# Patient Record
Sex: Female | Born: 1995 | Race: White | Hispanic: No | Marital: Single | Smoking: Former smoker
Health system: Southern US, Community
[De-identification: ages and names within clinical notes are randomized; demographics above are authoritative.]

## PROBLEM LIST (undated history)

## (undated) DIAGNOSIS — M545 Low back pain, unspecified: Secondary | ICD-10-CM

## (undated) HISTORY — PX: WISDOM TOOTH EXTRACTION: SHX21

---
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
## 2017-02-22 ENCOUNTER — Emergency Department: Payer: 59

## 2017-02-22 ENCOUNTER — Observation Stay
Admission: EM | Admit: 2017-02-22 | Discharge: 2017-02-23 | Disposition: A | Discharge status: Skilled Nursing Facility | Payer: 59 | Attending: Orthopedic Surgery | Admitting: Orthopedic Surgery

## 2017-02-22 ENCOUNTER — Encounter: Payer: Self-pay | Admitting: Emergency Medicine

## 2017-02-22 ENCOUNTER — Emergency Department: Payer: 59 | Admitting: Certified Registered Nurse Anesthetist

## 2017-02-22 ENCOUNTER — Encounter
Admission: EM | Disposition: A | Discharge status: Skilled Nursing Facility | Payer: Self-pay | Source: Home / Self Care | Attending: Student in an Organized Health Care Education/Training Program

## 2017-02-22 ENCOUNTER — Ambulatory Visit: Admit: 2017-02-22 | Payer: 59 | Admitting: Orthopedic Surgery

## 2017-02-22 DIAGNOSIS — S81811A Laceration without foreign body, right lower leg, initial encounter: Secondary | ICD-10-CM | POA: Diagnosis present

## 2017-02-22 DIAGNOSIS — S81012A Laceration without foreign body, left knee, initial encounter: Secondary | ICD-10-CM | POA: Diagnosis not present

## 2017-02-22 DIAGNOSIS — T07XXXA Unspecified multiple injuries, initial encounter: Secondary | ICD-10-CM

## 2017-02-22 DIAGNOSIS — S81002A Unspecified open wound, left knee, initial encounter: Secondary | ICD-10-CM | POA: Diagnosis present

## 2017-02-22 DIAGNOSIS — S8982XA Other specified injuries of left lower leg, initial encounter: Secondary | ICD-10-CM | POA: Insufficient documentation

## 2017-02-22 DIAGNOSIS — Y9355 Activity, bike riding: Secondary | ICD-10-CM | POA: Diagnosis not present

## 2017-02-22 DIAGNOSIS — Y9389 Activity, other specified: Secondary | ICD-10-CM | POA: Insufficient documentation

## 2017-02-22 HISTORY — PX: I&D EXTREMITY: SHX5045

## 2017-02-22 LAB — TYPE AND SCREEN
ABO/RH(D): A NEG
Antibody Screen: NEGATIVE

## 2017-02-22 LAB — CBC WITH DIFFERENTIAL/PLATELET
BASOS ABS: 0.1 10*3/uL (ref 0–0.1)
BASOS PCT: 1 %
Eosinophils Absolute: 0.2 10*3/uL (ref 0–0.7)
Eosinophils Relative: 2 %
HEMATOCRIT: 41.4 % (ref 35.0–47.0)
Hemoglobin: 14.1 g/dL (ref 12.0–16.0)
Lymphocytes Relative: 28 %
Lymphs Abs: 3 10*3/uL (ref 1.0–3.6)
MCH: 30 pg (ref 26.0–34.0)
MCHC: 34 g/dL (ref 32.0–36.0)
MCV: 88.3 fL (ref 80.0–100.0)
MONO ABS: 0.6 10*3/uL (ref 0.2–0.9)
Monocytes Relative: 5 %
NEUTROS ABS: 7.1 10*3/uL — AB (ref 1.4–6.5)
Neutrophils Relative %: 64 %
PLATELETS: 397 10*3/uL (ref 150–440)
RBC: 4.69 MIL/uL (ref 3.80–5.20)
RDW: 13 % (ref 11.5–14.5)
WBC: 10.9 10*3/uL (ref 3.6–11.0)

## 2017-02-22 LAB — COMPREHENSIVE METABOLIC PANEL
ALBUMIN: 4 g/dL (ref 3.5–5.0)
ALT: 24 U/L (ref 14–54)
AST: 30 U/L (ref 15–41)
Alkaline Phosphatase: 78 U/L (ref 38–126)
Anion gap: 8 (ref 5–15)
BILIRUBIN TOTAL: 0.8 mg/dL (ref 0.3–1.2)
BUN: 10 mg/dL (ref 6–20)
CHLORIDE: 109 mmol/L (ref 101–111)
CO2: 21 mmol/L — ABNORMAL LOW (ref 22–32)
Calcium: 8.8 mg/dL — ABNORMAL LOW (ref 8.9–10.3)
Creatinine, Ser: 0.62 mg/dL (ref 0.44–1.00)
GFR calc Af Amer: >60 mL/min (ref >60)
GFR calc non Af Amer: >60 mL/min (ref >60)
GLUCOSE: 121 mg/dL — AB (ref 65–99)
POTASSIUM: 3.7 mmol/L (ref 3.5–5.1)
Sodium: 138 mmol/L (ref 135–145)
Total Protein: 7.3 g/dL (ref 6.5–8.1)

## 2017-02-22 LAB — HCG, QUANTITATIVE, PREGNANCY: hCG, Beta Chain, Quant, S: <1 m[IU]/mL (ref <5)

## 2017-02-22 LAB — ETHANOL

## 2017-02-22 SURGERY — IRRIGATION AND DEBRIDEMENT EXTREMITY
Anesthesia: General | Site: Knee | Laterality: Left | Wound class: Dirty or Infected

## 2017-02-22 MED ORDER — SUCCINYLCHOLINE CHLORIDE 20 MG/ML IJ SOLN
INTRAMUSCULAR | Status: DC | PRN
  Administered 2017-02-22: 80 mg via INTRAVENOUS

## 2017-02-22 MED ORDER — CEFAZOLIN IN D5W 1 GM/50ML IV SOLN
1.0000 g | Freq: Once | INTRAVENOUS | Status: AC
  Administered 2017-02-22: 1 g via INTRAVENOUS
  Filled 2017-02-22: qty 50

## 2017-02-22 MED ORDER — ONDANSETRON HCL 4 MG/2ML IJ SOLN
4.0000 mg | Freq: Once | INTRAMUSCULAR | Status: AC
  Administered 2017-02-22: 4 mg via INTRAVENOUS

## 2017-02-22 MED ORDER — ACETAMINOPHEN 10 MG/ML IV SOLN
INTRAVENOUS | Status: DC | PRN
  Administered 2017-02-22: 1000 mg via INTRAVENOUS

## 2017-02-22 MED ORDER — MORPHINE SULFATE (PF) 4 MG/ML IV SOLN
INTRAVENOUS | Status: AC
  Administered 2017-02-22: 4 mg via INTRAVENOUS
  Filled 2017-02-22: qty 1

## 2017-02-22 MED ORDER — METHOCARBAMOL 500 MG PO TABS
500.0000 mg | ORAL_TABLET | Freq: Four times a day (QID) | ORAL | Status: DC | PRN
  Administered 2017-02-23 (×2): 500 mg via ORAL
  Filled 2017-02-22 (×2): qty 1

## 2017-02-22 MED ORDER — ONDANSETRON HCL 4 MG/2ML IJ SOLN
INTRAMUSCULAR | Status: AC
  Filled 2017-02-22: qty 2

## 2017-02-22 MED ORDER — DOCUSATE SODIUM 100 MG PO CAPS
100.0000 mg | ORAL_CAPSULE | Freq: Two times a day (BID) | ORAL | Status: DC
  Administered 2017-02-22 – 2017-02-23 (×2): 100 mg via ORAL
  Filled 2017-02-22 (×2): qty 1

## 2017-02-22 MED ORDER — SODIUM CHLORIDE 0.9 % IV SOLN
INTRAVENOUS | Status: DC
  Administered 2017-02-22: 21:00:00 via INTRAVENOUS

## 2017-02-22 MED ORDER — FENTANYL CITRATE (PF) 100 MCG/2ML IJ SOLN
100.0000 ug | INTRAMUSCULAR | Status: DC | PRN
  Administered 2017-02-22 (×4): 100 ug via INTRAVENOUS
  Filled 2017-02-22 (×4): qty 2

## 2017-02-22 MED ORDER — DEXAMETHASONE SODIUM PHOSPHATE 10 MG/ML IJ SOLN
INTRAMUSCULAR | Status: AC
  Filled 2017-02-22: qty 1

## 2017-02-22 MED ORDER — BUPIVACAINE HCL (PF) 0.5 % IJ SOLN
30.0000 mL | Freq: Once | INTRAMUSCULAR | Status: AC
  Administered 2017-02-22: 30 mL
  Filled 2017-02-22: qty 30

## 2017-02-22 MED ORDER — MIDAZOLAM HCL 2 MG/2ML IJ SOLN
INTRAMUSCULAR | Status: AC
  Filled 2017-02-22: qty 2

## 2017-02-22 MED ORDER — ZOLPIDEM TARTRATE 5 MG PO TABS: 5.0000 mg | ORAL_TABLET | Freq: Every evening | ORAL | Status: DC | PRN

## 2017-02-22 MED ORDER — LIDOCAINE HCL (PF) 2 % IJ SOLN
INTRAMUSCULAR | Status: AC
  Filled 2017-02-22: qty 2

## 2017-02-22 MED ORDER — BUPIVACAINE HCL 0.5 % IJ SOLN
INTRAMUSCULAR | Status: DC | PRN
Start: 2017-02-22 — End: 2017-02-22
  Administered 2017-02-22: 30 mL

## 2017-02-22 MED ORDER — ONDANSETRON HCL 4 MG/2ML IJ SOLN: 4.0000 mg | Freq: Once | INTRAMUSCULAR | Status: DC

## 2017-02-22 MED ORDER — PROPOFOL 10 MG/ML IV BOLUS
INTRAVENOUS | Status: DC | PRN
  Administered 2017-02-22: 150 mg via INTRAVENOUS
  Administered 2017-02-22: 50 mg via INTRAVENOUS

## 2017-02-22 MED ORDER — ACETAMINOPHEN 325 MG PO TABS: 650.0000 mg | ORAL_TABLET | Freq: Four times a day (QID) | ORAL | Status: DC | PRN

## 2017-02-22 MED ORDER — SODIUM CHLORIDE 0.9 % IR SOLN
Status: DC | PRN
  Administered 2017-02-22: 3000 mL

## 2017-02-22 MED ORDER — HYDROCODONE-ACETAMINOPHEN 5-325 MG PO TABS
1.0000 | ORAL_TABLET | ORAL | Status: DC | PRN
  Administered 2017-02-22 (×2): 1 via ORAL
  Administered 2017-02-23: 2 via ORAL
  Administered 2017-02-23: 1 via ORAL
  Administered 2017-02-23 (×2): 2 via ORAL
  Filled 2017-02-22 (×4): qty 2
  Filled 2017-02-22 (×3): qty 1

## 2017-02-22 MED ORDER — ONDANSETRON HCL 4 MG PO TABS: 4.0000 mg | ORAL_TABLET | Freq: Four times a day (QID) | ORAL | Status: DC | PRN

## 2017-02-22 MED ORDER — MEPERIDINE HCL 50 MG/ML IJ SOLN
6.2500 mg | INTRAMUSCULAR | Status: DC | PRN
  Administered 2017-02-22: 12.5 mg via INTRAVENOUS

## 2017-02-22 MED ORDER — ACETAMINOPHEN 650 MG RE SUPP: 650.0000 mg | Freq: Four times a day (QID) | RECTAL | Status: DC | PRN

## 2017-02-22 MED ORDER — MEPERIDINE HCL 50 MG/ML IJ SOLN
INTRAMUSCULAR | Status: AC
Start: 2017-02-22 — End: 2017-02-23
  Filled 2017-02-22: qty 1

## 2017-02-22 MED ORDER — BUPIVACAINE HCL (PF) 0.5 % IJ SOLN
INTRAMUSCULAR | Status: AC
  Filled 2017-02-22: qty 30

## 2017-02-22 MED ORDER — IOPAMIDOL (ISOVUE-300) INJECTION 61%
100.0000 mL | Freq: Once | INTRAVENOUS | Status: AC | PRN
  Administered 2017-02-22: 100 mL via INTRAVENOUS

## 2017-02-22 MED ORDER — METOCLOPRAMIDE HCL 10 MG PO TABS: 5.0000 mg | ORAL_TABLET | Freq: Three times a day (TID) | ORAL | Status: DC | PRN

## 2017-02-22 MED ORDER — MIDAZOLAM HCL 2 MG/2ML IJ SOLN
INTRAMUSCULAR | Status: DC | PRN
  Administered 2017-02-22: 2 mg via INTRAVENOUS

## 2017-02-22 MED ORDER — NALOXONE HCL 0.4 MG/ML IJ SOLN
0.2000 mg | Freq: Once | INTRAMUSCULAR | Status: DC
  Filled 2017-02-22: qty 1

## 2017-02-22 MED ORDER — FENTANYL CITRATE (PF) 100 MCG/2ML IJ SOLN: 25.0000 ug | INTRAMUSCULAR | Status: DC | PRN

## 2017-02-22 MED ORDER — SUCCINYLCHOLINE CHLORIDE 20 MG/ML IJ SOLN
INTRAMUSCULAR | Status: AC
  Filled 2017-02-22: qty 1

## 2017-02-22 MED ORDER — MORPHINE SULFATE (PF) 2 MG/ML IV SOLN
2.0000 mg | INTRAVENOUS | Status: DC | PRN
  Administered 2017-02-23 (×2): 2 mg via INTRAVENOUS
  Filled 2017-02-22 (×2): qty 1

## 2017-02-22 MED ORDER — TETANUS-DIPHTH-ACELL PERTUSSIS 5-2.5-18.5 LF-MCG/0.5 IM SUSP
0.5000 mL | Freq: Once | INTRAMUSCULAR | Status: AC
  Administered 2017-02-22: 0.5 mL via INTRAMUSCULAR
  Filled 2017-02-22: qty 0.5

## 2017-02-22 MED ORDER — FENTANYL CITRATE (PF) 100 MCG/2ML IJ SOLN
INTRAMUSCULAR | Status: DC | PRN
  Administered 2017-02-22 (×2): 50 ug via INTRAVENOUS

## 2017-02-22 MED ORDER — ONDANSETRON HCL 4 MG/2ML IJ SOLN
INTRAMUSCULAR | Status: AC
  Administered 2017-02-22: 4 mg via INTRAVENOUS
  Filled 2017-02-22: qty 2

## 2017-02-22 MED ORDER — PROPOFOL 10 MG/ML IV BOLUS
INTRAVENOUS | Status: AC
  Filled 2017-02-22: qty 20

## 2017-02-22 MED ORDER — MORPHINE SULFATE (PF) 4 MG/ML IV SOLN
4.0000 mg | INTRAVENOUS | Status: DC | PRN
  Administered 2017-02-22: 4 mg via INTRAVENOUS

## 2017-02-22 MED ORDER — ONDANSETRON HCL 4 MG/2ML IJ SOLN: 4.0000 mg | Freq: Once | INTRAMUSCULAR | Status: DC | PRN

## 2017-02-22 MED ORDER — METHOCARBAMOL 1000 MG/10ML IJ SOLN
500.0000 mg | Freq: Four times a day (QID) | INTRAVENOUS | Status: DC | PRN
  Filled 2017-02-22: qty 5

## 2017-02-22 MED ORDER — FENTANYL CITRATE (PF) 100 MCG/2ML IJ SOLN
INTRAMUSCULAR | Status: AC
  Filled 2017-02-22: qty 2

## 2017-02-22 MED ORDER — SODIUM CHLORIDE 0.9 % IV BOLUS (SEPSIS)
1000.0000 mL | Freq: Once | INTRAVENOUS | Status: AC
  Administered 2017-02-22: 1000 mL via INTRAVENOUS

## 2017-02-22 MED ORDER — LIDOCAINE HCL (CARDIAC) 20 MG/ML IV SOLN
INTRAVENOUS | Status: DC | PRN
  Administered 2017-02-22: 80 mg via INTRAVENOUS

## 2017-02-22 MED ORDER — METOCLOPRAMIDE HCL 5 MG/ML IJ SOLN: 5.0000 mg | Freq: Three times a day (TID) | INTRAMUSCULAR | Status: DC | PRN

## 2017-02-22 MED ORDER — DEXAMETHASONE SODIUM PHOSPHATE 10 MG/ML IJ SOLN
INTRAMUSCULAR | Status: DC | PRN
  Administered 2017-02-22: 10 mg via INTRAVENOUS

## 2017-02-22 MED ORDER — ONDANSETRON HCL 4 MG/2ML IJ SOLN
INTRAMUSCULAR | Status: DC | PRN
  Administered 2017-02-22: 4 mg via INTRAVENOUS

## 2017-02-22 MED ORDER — LACTATED RINGERS IV SOLN
INTRAVENOUS | Status: DC | PRN
  Administered 2017-02-22: 19:00:00 via INTRAVENOUS

## 2017-02-22 MED ORDER — CEFAZOLIN IN D5W 1 GM/50ML IV SOLN
1.0000 g | Freq: Four times a day (QID) | INTRAVENOUS | Status: DC
  Administered 2017-02-23 (×3): 1 g via INTRAVENOUS
  Filled 2017-02-22 (×4): qty 50

## 2017-02-22 MED ORDER — ONDANSETRON HCL 4 MG/2ML IJ SOLN: 4.0000 mg | Freq: Four times a day (QID) | INTRAMUSCULAR | Status: DC | PRN

## 2017-02-22 SURGICAL SUPPLY — 33 items
BNDG CMPR 75X21 PLY HI ABS (MISCELLANEOUS) ×1
BNDG ESMARK 4X12 TAN STRL LF (GAUZE/BANDAGES/DRESSINGS) ×3 IMPLANT
BRUSH STANDARD PREP 14M (MISCELLANEOUS) ×2 IMPLANT
CANISTER SUCT 1200ML W/VALVE (MISCELLANEOUS) ×3 IMPLANT
CUFF TOURN 18 STER (MISCELLANEOUS) IMPLANT
CUFF TOURN 24 STER (MISCELLANEOUS) ×2 IMPLANT
DURAPREP 26ML APPLICATOR (WOUND CARE) ×1 IMPLANT
ELECT REM PT RETURN 9FT ADLT (ELECTROSURGICAL) ×3
ELECTRODE REM PT RTRN 9FT ADLT (ELECTROSURGICAL) ×1 IMPLANT
GAUZE PETRO XEROFOAM 1X8 (MISCELLANEOUS) ×3 IMPLANT
GAUZE SPONGE 4X4 12PLY STRL (GAUZE/BANDAGES/DRESSINGS) ×3 IMPLANT
GAUZE STRETCH 2X75IN STRL (MISCELLANEOUS) ×3 IMPLANT
GLOVE BIOGEL M STRL SZ7.5 (GLOVE) ×3 IMPLANT
GLOVE BIOGEL PI IND STRL 9 (GLOVE) ×1 IMPLANT
GLOVE BIOGEL PI INDICATOR 9 (GLOVE) ×2
GLOVE SURG SYN 9.0  PF PI (GLOVE) ×2
GLOVE SURG SYN 9.0 PF PI (GLOVE) ×1 IMPLANT
GOWN STRL REUS W/ TWL LRG LVL3 (GOWN DISPOSABLE) ×1 IMPLANT
GOWN STRL REUS W/TWL LRG LVL3 (GOWN DISPOSABLE) ×3
KIT RM TURNOVER STRD PROC AR (KITS) ×3 IMPLANT
MAT BLUE FLOOR 46X72 FLO (MISCELLANEOUS) ×2 IMPLANT
NDL SAFETY 25GX1.5 (NEEDLE) ×3 IMPLANT
NS IRRIG 500ML POUR BTL (IV SOLUTION) ×3 IMPLANT
PACK EXTREMITY ARMC (MISCELLANEOUS) ×3 IMPLANT
SOL .9 NS 3000ML IRR  AL (IV SOLUTION) ×2
SOL .9 NS 3000ML IRR AL (IV SOLUTION) ×1
SOL .9 NS 3000ML IRR UROMATIC (IV SOLUTION) IMPLANT
STOCKINETTE STRL 6IN 960660 (GAUZE/BANDAGES/DRESSINGS) ×3 IMPLANT
STRAP SAFETY BODY (MISCELLANEOUS) ×3 IMPLANT
SUT ETHILON 4-0 (SUTURE) ×6
SUT ETHILON 4-0 FS2 18XMFL BLK (SUTURE) ×2
SUTURE ETHLN 4-0 FS2 18XMF BLK (SUTURE) ×1 IMPLANT
TRAY PREP VAG/GEN (MISCELLANEOUS) ×2 IMPLANT

## 2017-02-22 NOTE — Anesthesia Postprocedure Evaluation (Signed)
Anesthesia Post Note  Patient: [name]  Procedure(s) Performed: Procedure(s) (LRB): IRRIGATION AND DEBRIDEMENT left knee and deep laceration (Left)  Patient location during evaluation: PACU Anesthesia Type: General Level of consciousness: awake and alert Pain management: pain level controlled Vital Signs Assessment: post-procedure vital signs reviewed and stable Respiratory status: spontaneous breathing, nonlabored ventilation, respiratory function stable and patient connected to nasal cannula oxygen Cardiovascular status: blood pressure returned to baseline and stable Postop Assessment: no signs of nausea or vomiting Anesthetic complications: no     Last Vitals:  Vitals:   02/22/17 2047 02/22/17 2104  BP: 97/61 (!) 100/54  Pulse: 76 76  Resp: 15 19  Temp:  36.7 C    Last Pain:  Vitals:   02/22/17 2104  TempSrc: Oral  PainSc:                  Jahlil Ziller S
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
## 2017-02-22 NOTE — Transfer of Care (Signed)
Immediate Anesthesia Transfer of Care Note  Patient: Pamela [name]  Procedure(s) Performed: Procedure(s): IRRIGATION AND DEBRIDEMENT left knee and deep laceration (Left)  Patient Location: PACU  Anesthesia Type:General  Level of Consciousness: awake and alert   Airway & Oxygen Therapy: Patient Spontanous Breathing and Patient connected to face mask oxygen  Post-op Assessment: Report given to RN and Post -op Vital signs reviewed and stable  Post vital signs: Reviewed and stable  Last Vitals:  Vitals:   02/22/17 1745 02/22/17 1825  BP:  (!) 124/58  Pulse: 96 82  Resp: 17 14  Temp:      Last Pain:  Vitals:   02/22/17 1820  TempSrc:   PainSc: 9          Complications: No apparent anesthesia complications

## 2017-02-22 NOTE — Op Note (Signed)
02/22/2017  8:19 PM  PATIENT:  Pamela [name]  21 y.o. female  PRE-OPERATIVE DIAGNOSIS:  left knee laceration, open joint injury  POST-OPERATIVE DIAGNOSIS:  left knee laceration open joint injury  PROCEDURE:  Procedure(s): IRRIGATION AND DEBRIDEMENT left knee and deep laceration (Left)  SURGEON: Leitha Schuller, MD  ASSISTANTS: None  ANESTHESIA:   general  EBL:  Total I/O In: 400 [I.V.:400] Out: 5 [Blood:5]  BLOOD ADMINISTERED:none  DRAINS: none   LOCAL MEDICATIONS USED:  MARCAINE     SPECIMEN:  No Specimen  DISPOSITION OF SPECIMEN:  N/A  COUNTS:  YES  TOURNIQUET:    IMPLANTS: None  DICTATION: .Dragon Dictation patient brought the operating room and after adequate anesthesia was obtained the left leg was prepped and draped in sterile fashion. After patient identification and timeout procedures were completed with liner was placed in the wound and the wound examined there was a small penetrating area of the anterior medial knee appeared to go to the joint without any form material within the joint the knee was then thoroughly irrigated this wound approximate 10 cm in length with pulsatile lavage using a dilute Betadine solution after this was completed and there is no more foreign material present sharp dissection with a scalpel was used to debride the proximal skin flap the skin edge was quite thin and this proximally Court eighth of an inch of skin was excised as it appeared devitalized the wound was then loosely closed with 2-0 Vicryl subcutaneously and interrupted 4-0 nylon for the skin getting good approximation of the edges but allowing for drainage the wound appeared clean at this point Xeroform 4 x 4 web roll and Ace wrap applied there were no complications no specimen quite  PLAN OF CARE: Admit for overnight observation  PATIENT DISPOSITION:  PACU - hemodynamically stable.
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
## 2017-02-22 NOTE — Anesthesia Procedure Notes (Signed)
Procedure Name: Intubation Performed by: Floree Zuniga Pre-anesthesia Checklist: Patient identified, Patient being monitored, Timeout performed, Emergency Drugs available and Suction available Patient Re-evaluated:Patient Re-evaluated prior to inductionOxygen Delivery Method: Circle system utilized Preoxygenation: Pre-oxygenation with 100% oxygen Intubation Type: IV induction, Rapid sequence and Cricoid Pressure applied Laryngoscope Size: Mac and 3 Grade View: Grade I Tube type: Oral Tube size: 7.0 mm Number of attempts: 1 Airway Equipment and Method: Stylet Placement Confirmation: ETT inserted through vocal cords under direct vision,  positive ETCO2 and breath sounds checked- equal and bilateral Secured at: 21 cm Tube secured with: Tape Dental Injury: Teeth and Oropharynx as per pre-operative assessment        
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
## 2017-02-22 NOTE — Anesthesia Post-op Follow-up Note (Cosign Needed)
Anesthesia QCDR form completed.        
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
## 2017-02-22 NOTE — ED Notes (Signed)
Patient transported to CT 

## 2017-02-23 ENCOUNTER — Encounter: Payer: Self-pay | Admitting: Orthopedic Surgery

## 2017-02-23 MED ORDER — HYDROCODONE-ACETAMINOPHEN 5-325 MG PO TABS: 1.0000 | ORAL_TABLET | ORAL | 0 refills | Status: DC | PRN

## 2017-02-23 MED ORDER — METHOCARBAMOL 500 MG PO TABS
500.0000 mg | ORAL_TABLET | Freq: Four times a day (QID) | ORAL | 0 refills | Status: DC | PRN
Start: 2017-02-23 — End: 2020-03-22

## 2017-02-23 NOTE — Discharge Instructions (Signed)
Diet: As you were doing prior to hospitalization   Dressing: Keep dressing clean and dry at all times.   Activity:  Increase activity slowly as tolerated, but follow the weight bearing instructions below.  No lifting or driving for 6 weeks.  Weight Bearing:   Weight bearing as tolerated   To prevent constipation: you may use a stool softener such as -  Colace (over the counter) 100 mg by mouth twice a day  Drink plenty of fluids (prune juice may be helpful) and high fiber foods Miralax (over the counter) for constipation as needed.    Itching:  If you experience itching with your medications, try taking only a single pain pill, or even half a pain pill at a time.  You may take up to 10 pain pills per day, and you can also use benadryl over the counter for itching or also to help with sleep.   Precautions:  If you experience chest pain or shortness of breath - call 911 immediately for transfer to the hospital emergency department!!  If you develop a fever greater that 101 F, purulent drainage from wound, increased redness or drainage from wound, or calf pain-Call [facility]                                              Follow- Up Appointment:  Please call for an appointment to be seen in 3 days at [facility]

## 2017-02-23 NOTE — Plan of Care (Signed)
Problem: Safety: Goal: Ability to remain free from injury will improve Outcome: Progressing Pt rings for assistance when she needs to use the bathroom.  Problem: Pain Managment: Goal: General experience of comfort will improve Outcome: Progressing Pain control with oral and iv pain medications  Problem: Skin Integrity: Goal: Risk for impaired skin integrity will decrease Outcome: Progressing Surgical dressing remaining dry and intact.  Problem: Nutrition: Goal: Adequate nutrition will be maintained Outcome: Progressing Eating and drinking without difficulty.

## 2017-02-23 NOTE — Progress Notes (Signed)
Patient is being discharged to home today. Parents are here to take her home. DC/Rx instructions given and patient acknowledged understanding.
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
## 2017-02-24 LAB — HIV ANTIBODY (ROUTINE TESTING W REFLEX): HIV SCREEN 4TH GENERATION: NONREACTIVE

## 2017-02-24 LAB — POCT PREGNANCY, URINE: PREG TEST UR: NEGATIVE

## 2017-09-12 ENCOUNTER — Encounter: Payer: Self-pay | Admitting: *Deleted

## 2017-09-25 NOTE — Discharge Instructions (Signed)
T & A INSTRUCTION SHEET - [facility] °[facility] ° °CREIGHTON VAUGHT, MD °PAUL H. JUENGEL, MD  °P. SCOTT BENNETT °CHAPMAN MCQUEEN, MD ° °[address] [place], [place] [address] TEL. [phone] °[address] [place] [address] [phone] ° °INFORMATION SHEET FOR A TONSILLECTOMY AND ADENDOIDECTOMY ° °About Your Tonsils and Adenoids ° The tonsils and adenoids are normal body tissues that are part of our immune system.  They normally help to protect us against diseases that may enter our mouth and nose.  However, sometimes the tonsils and/or adenoids become too large and obstruct our breathing, especially at night. °  ° If either of these things happen it helps to remove the tonsils and adenoids in order to become healthier. The operation to remove the tonsils and adenoids is called a tonsillectomy and adenoidectomy. ° °The Location of Your Tonsils and Adenoids ° The tonsils are located in the back of the throat on both side and sit in a cradle of muscles. The adenoids are located in the roof of the mouth, behind the nose, and closely associated with the opening of the Eustachian tube to the ear. ° °Surgery on Tonsils and Adenoids ° A tonsillectomy and adenoidectomy is a short operation which takes about thirty minutes.  This includes being put to sleep and being awakened.  Tonsillectomies and adenoidectomies are performed at [facility] and may require observation period in the recovery room prior to going home. ° °Following the Operation for a Tonsillectomy ° A cautery machine is used to control bleeding.  Bleeding from a tonsillectomy and adenoidectomy is minimal and postoperatively the risk of bleeding is approximately four percent, although this rarely life threatening. ° ° ° °After your tonsillectomy and adenoidectomy post-op care at home: ° °1. Our patients are able to go home the same day.  You may be given prescriptions for pain  medications and antibiotics, if indicated. °2. It is extremely important to remember that fluid intake is of utmost importance after a tonsillectomy.  The amount that you drink must be maintained in the postoperative period.  A good indication of whether a child is getting enough fluid is whether his/her urine output is constant.  As long as children are urinating or wetting their diaper every 6 - 8 hours this is usually enough fluid intake.   °3. Although rare, this is a risk of some bleeding in the first ten days after surgery.  This is usually occurs between day five and nine postoperatively.  This risk of bleeding is approximately four percent.  If you or your child should have any bleeding you should remain calm and notify our office or go directly to the Emergency Room at [facility] where they will contact us. Our doctors are available seven days a week for notification.  We recommend sitting up quietly in a chair, place an ice pack on the front of the neck and spitting out the blood gently until we are able to contact you.  Adults should gargle gently with ice water and this may help stop the bleeding.  If the bleeding does not stop after a short time, i.e. 10 to 15 minutes, or seems to be increasing again, please contact us or go to the hospital.   °4. It is common for the pain to be worse at 5 - 7 days postoperatively.  This occurs because the “scab” is peeling off and the mucous membrane (skin of   the throat) is growing back where the tonsils were.   °5. It is common for a low-grade fever, less than 102, during the first week after a tonsillectomy and adenoidectomy.  It is usually due to not drinking enough liquids, and we suggest your use liquid Tylenol or the pain medicine with Tylenol prescribed in order to keep your temperature below 102.  Please follow the directions on the back of the bottle. °6. Do not take aspirin or any products that contain aspirin such as Bufferin, Anacin,  Ecotrin, aspirin gum, Goodies, BC headache powders, etc., after a T&A because it can promote bleeding.  Please check with our office before administering any other medication that may been prescribed by other doctors during the two week post-operative period. °7. If you happen to look in the mirror or into your child’s mouth you will see white/gray patches on the back of the throat.  This is what a scab looks like in the mouth and is normal after having a T&A.  It will disappear once the tonsil area heals completely. However, it may cause a noticeable odor, and this too will disappear with time.     °8. You or your child may experience ear pain after having a T&A.  This is called referred pain and comes from the throat, but it is felt in the ears.  Ear pain is quite common and expected.  It will usually go away after ten days.  There is usually nothing wrong with the ears, and it is primarily due to the healing area stimulating the nerve to the ear that runs along the side of the throat.  Use either the prescribed pain medicine or Tylenol as needed.  °9. The throat tissues after a tonsillectomy are obviously sensitive.  Smoking around children who have had a tonsillectomy significantly increases the risk of bleeding.  DO NOT SMOKE!  ° °General Anesthesia, Adult, Care After °These instructions provide you with information about caring for yourself after your procedure. Your health care provider may also give you more specific instructions. Your treatment has been planned according to current medical practices, but problems sometimes occur. Call your health care provider if you have any problems or questions after your procedure. °What can I expect after the procedure? °After the procedure, it is common to have: °· Vomiting. °· A sore throat. °· Mental slowness. ° °It is common to feel: °· Nauseous. °· Cold or shivery. °· Sleepy. °· Tired. °· Sore or achy, even in parts of your body where you did not have  surgery. ° °Follow these instructions at home: °For at least 24 hours after the procedure: °· Do not: °? Participate in activities where you could fall or become injured. °? Drive. °? Use heavy machinery. °? Drink alcohol. °? Take sleeping pills or medicines that cause drowsiness. °? Make important decisions or sign legal documents. °? Take care of children on your own. °· Rest. °Eating and drinking °· If you vomit, drink water, juice, or soup when you can drink without vomiting. °· Drink enough fluid to keep your urine clear or pale yellow. °· Make sure you have little or no nausea before eating solid foods. °· Follow the diet recommended by your health care provider. °General instructions °· Have a responsible adult stay with you until you are awake and alert. °· Return to your normal activities as told by your health care provider. Ask your health care provider what activities are safe for you. °· Take over-the-counter and   prescription medicines only as told by your health care provider. °· If you smoke, do not smoke without supervision. °· Keep all follow-up visits as told by your health care provider. This is important. °Contact a health care provider if: °· You continue to have nausea or vomiting at home, and medicines are not helpful. °· You cannot drink fluids or start eating again. °· You cannot urinate after 8-12 hours. °· You develop a skin rash. °· You have fever. °· You have increasing redness at the site of your procedure. °Get help right away if: °· You have difficulty breathing. °· You have chest pain. °· You have unexpected bleeding. °· You feel that you are having a life-threatening or urgent problem. °This information is not intended to replace advice given to you by your health care provider. Make sure you discuss any questions you have with your health care provider. °Document Released: 02/05/2001 Document Revised: 04/03/2016 Document Reviewed: 10/14/2015 °Elsevier Interactive Patient Education  © 2018 [employer] ° °

## 2017-09-27 ENCOUNTER — Encounter
Admission: RE | Disposition: A | Discharge status: Home / Self Care | Payer: Self-pay | Source: Ambulatory Visit | Attending: Otolaryngology

## 2017-09-27 ENCOUNTER — Ambulatory Visit: Payer: 59 | Admitting: Anesthesiology

## 2017-09-27 ENCOUNTER — Ambulatory Visit
Admission: RE | Admit: 2017-09-27 | Discharge: 2017-09-27 | Disposition: A | Discharge status: Home / Self Care | Payer: 59 | Source: Ambulatory Visit | Attending: Otolaryngology | Admitting: Otolaryngology

## 2017-09-27 DIAGNOSIS — J3501 Chronic tonsillitis: Secondary | ICD-10-CM | POA: Diagnosis not present

## 2017-09-27 DIAGNOSIS — E669 Obesity, unspecified: Secondary | ICD-10-CM | POA: Insufficient documentation

## 2017-09-27 DIAGNOSIS — F17210 Nicotine dependence, cigarettes, uncomplicated: Secondary | ICD-10-CM | POA: Diagnosis not present

## 2017-09-27 HISTORY — DX: Low back pain, unspecified: M54.50

## 2017-09-27 HISTORY — DX: Low back pain: M54.5

## 2017-09-27 HISTORY — PX: TONSILLECTOMY: SHX5217

## 2017-09-27 SURGERY — TONSILLECTOMY
Anesthesia: General | Laterality: Bilateral | Wound class: Clean Contaminated

## 2017-09-27 MED ORDER — PROPOFOL 10 MG/ML IV BOLUS
INTRAVENOUS | Status: DC | PRN
  Administered 2017-09-27: 200 mg via INTRAVENOUS

## 2017-09-27 MED ORDER — OXYCODONE HCL 5 MG PO TABS
5.0000 mg | ORAL_TABLET | Freq: Once | ORAL | Status: AC | PRN
Start: 2017-09-27 — End: 2017-09-27

## 2017-09-27 MED ORDER — MIDAZOLAM HCL 5 MG/5ML IJ SOLN
INTRAMUSCULAR | Status: DC | PRN
  Administered 2017-09-27: 2 mg via INTRAVENOUS

## 2017-09-27 MED ORDER — FENTANYL CITRATE (PF) 100 MCG/2ML IJ SOLN
INTRAMUSCULAR | Status: DC | PRN
  Administered 2017-09-27: 100 ug via INTRAVENOUS

## 2017-09-27 MED ORDER — OXYCODONE HCL 5 MG/5ML PO SOLN
5.0000 mg | Freq: Once | ORAL | Status: AC | PRN
  Administered 2017-09-27: 5 mg via ORAL

## 2017-09-27 MED ORDER — ONDANSETRON HCL 4 MG/2ML IJ SOLN
INTRAMUSCULAR | Status: DC | PRN
  Administered 2017-09-27: 4 mg via INTRAVENOUS

## 2017-09-27 MED ORDER — SUCCINYLCHOLINE CHLORIDE 20 MG/ML IJ SOLN
INTRAMUSCULAR | Status: DC | PRN
  Administered 2017-09-27: 100 mg via INTRAVENOUS

## 2017-09-27 MED ORDER — FENTANYL CITRATE (PF) 100 MCG/2ML IJ SOLN
25.0000 ug | INTRAMUSCULAR | Status: DC | PRN
Start: 2017-09-27 — End: 2017-09-27
  Administered 2017-09-27 (×2): 50 ug via INTRAVENOUS
  Administered 2017-09-27 (×2): 25 ug via INTRAVENOUS

## 2017-09-27 MED ORDER — LIDOCAINE HCL (CARDIAC) 20 MG/ML IV SOLN
INTRAVENOUS | Status: DC | PRN
  Administered 2017-09-27: 50 mg via INTRAVENOUS

## 2017-09-27 MED ORDER — DEXAMETHASONE SODIUM PHOSPHATE 4 MG/ML IJ SOLN
INTRAMUSCULAR | Status: DC | PRN
  Administered 2017-09-27: 10 mg via INTRAVENOUS

## 2017-09-27 MED ORDER — ACETAMINOPHEN 10 MG/ML IV SOLN
1000.0000 mg | Freq: Once | INTRAVENOUS | Status: AC
  Administered 2017-09-27: 1000 mg via INTRAVENOUS

## 2017-09-27 MED ORDER — LACTATED RINGERS IV SOLN
INTRAVENOUS | Status: DC
  Administered 2017-09-27: 07:00:00 via INTRAVENOUS

## 2017-09-27 SURGICAL SUPPLY — 12 items
CANISTER SUCT 1200ML W/VALVE (MISCELLANEOUS) ×3 IMPLANT
ELECT CAUTERY BLADE TIP 2.5 (TIP) ×3
ELECTRODE CAUTERY BLDE TIP 2.5 (TIP) ×1 IMPLANT
GLOVE PI ULTRA LF STRL 7.5 (GLOVE) ×1 IMPLANT
GLOVE PI ULTRA NON LATEX 7.5 (GLOVE) ×2
KIT ROOM TURNOVER OR (KITS) ×3 IMPLANT
PACK TONSIL/ADENOIDS (PACKS) ×3 IMPLANT
PAD GROUND ADULT SPLIT (MISCELLANEOUS) ×3 IMPLANT
PENCIL ELECTRO HAND CTR (MISCELLANEOUS) ×3 IMPLANT
SOL ANTI-FOG 6CC FOG-OUT (MISCELLANEOUS) ×1 IMPLANT
SOL FOG-OUT ANTI-FOG 6CC (MISCELLANEOUS) ×2
STRAP BODY AND KNEE 60X3 (MISCELLANEOUS) ×3 IMPLANT

## 2017-09-27 NOTE — Anesthesia Postprocedure Evaluation (Signed)
Anesthesia Post Note  Patient: Pamela [name]  Procedure(s) Performed: TONSILLECTOMY (Bilateral )  Patient location during evaluation: PACU Anesthesia Type: General Level of consciousness: awake Pain management: pain level controlled Vital Signs Assessment: post-procedure vital signs reviewed and stable Respiratory status: spontaneous breathing Cardiovascular status: blood pressure returned to baseline Postop Assessment: no headache Anesthetic complications: no    Beckey DowningEric Tylasia Fletchall

## 2017-09-27 NOTE — H&P (Signed)
H&P has been reviewedand patient reevaluated,  and no changes necessary. To be downloaded later.  

## 2017-09-27 NOTE — Transfer of Care (Signed)
Immediate Anesthesia Transfer of Care Note  Patient: Pamela [name]  Procedure(s) Performed: TONSILLECTOMY (Bilateral )  Patient Location: PACU  Anesthesia Type: General  Level of Consciousness: awake, alert  and patient cooperative  Airway and Oxygen Therapy: Patient Spontanous Breathing and Patient connected to supplemental oxygen  Post-op Assessment: Post-op Vital signs reviewed, Patient's Cardiovascular Status Stable, Respiratory Function Stable, Patent Airway and No signs of Nausea or vomiting  Post-op Vital Signs: Reviewed and stable  Complications: No apparent anesthesia complications

## 2017-09-27 NOTE — Anesthesia Preprocedure Evaluation (Addendum)
Anesthesia Evaluation  Patient identified by MRN, date of birth, ID band Patient awake    Reviewed: Allergy & Precautions, NPO status , Patient's Chart, lab work & pertinent test results, reviewed documented beta blocker date and time   Airway Mallampati: II  TM Distance: >3 FB Neck ROM: Full    Dental no notable dental hx.    Pulmonary Current Smoker,    Pulmonary exam normal breath sounds clear to auscultation       Cardiovascular negative cardio ROS Normal cardiovascular exam Rhythm:Regular Rate:Normal     Neuro/Psych negative neurological ROS  negative psych ROS   GI/Hepatic negative GI ROS, Neg liver ROS,   Endo/Other  negative endocrine ROS  Renal/GU negative Renal ROS  negative genitourinary   Musculoskeletal negative musculoskeletal ROS (+)   Abdominal (+) + obese,   Peds  Hematology   Anesthesia Other Findings   Reproductive/Obstetrics negative OB ROS                            Anesthesia Physical Anesthesia Plan  ASA: II  Anesthesia Plan: General   Post-op Pain Management:    Induction:   PONV Risk Score and Plan:   Airway Management Planned: Oral ETT  Additional Equipment: None  Intra-op Plan:   Post-operative Plan: Extubation in OR  Informed Consent: I have reviewed the patients History and Physical, chart, labs and discussed the procedure including the risks, benefits and alternatives for the proposed anesthesia with the patient or authorized representative who has indicated his/her understanding and acceptance.     Plan Discussed with: CRNA, Anesthesiologist and Surgeon  Anesthesia Plan Comments:         Anesthesia Quick Evaluation

## 2017-09-27 NOTE — Anesthesia Procedure Notes (Signed)
Procedure Name: Intubation Date/Time: 09/27/2017 7:39 AM Performed by: Londell Moh, CRNA Pre-anesthesia Checklist: Patient identified, Emergency Drugs available, Suction available, Patient being monitored and Timeout performed Patient Re-evaluated:Patient Re-evaluated prior to induction Oxygen Delivery Method: Circle system utilized Preoxygenation: Pre-oxygenation with 100% oxygen Induction Type: IV induction Ventilation: Mask ventilation without difficulty Laryngoscope Size: Mac and 3 Grade View: Grade I Tube type: Oral Rae Tube size: 7.0 mm Number of attempts: 1 Placement Confirmation: ETT inserted through vocal cords under direct vision,  positive ETCO2 and breath sounds checked- equal and bilateral Tube secured with: Tape Dental Injury: Teeth and Oropharynx as per pre-operative assessment

## 2017-09-27 NOTE — Op Note (Signed)
09/27/2017  8:05 AM    Pamela [name], [name]  [id number]   Pre-Op Dx:  Chronic tonsillitis  Post-op Dx: Chronic tonsillitis with previous peritonsillar abscesses  Proc: Tonsillectomy   Surg:  [name]  Anes:  GOT  EBL:  30 mL  Comp:  None  Findings:  Both tonsils were scarred down and the peritonsillar space with evidence of previous peritonsillar abscesses. The right side was worse than the left.  Procedure: The patient was given general anesthesia by oral endotracheal intubation and placed in a supine position. A Davis mouth gag was used to visualize the oropharynx. The tonsils were quite enlarged and the right side was pushed more medially. Neither one was acutely inflamed. The soft palate was retracted to visualize the adenoids and these were completely flat with no significant adenoid hypertrophy at all. The adenoids were not addressed.  The left tonsil was grasped and pulled medially. The anterior pillar was incised with cautery. The tonsil was dissected from its fossa using blunt cautery and blunt dissection. There is portion in the mid tonsil were was densely adherent to the underlying muscle were previous abscess was. This cut through and electrocautery. Once the tonsil was removed the bleeding was controlled with electrocautery. The right tonsil was then grasped medially again. The anterior pillar was incised electrocautery and you can see this was densely adherent superiorly and all around the midportion of the tonsil.. There had been a fairly large peritonsillar abscess here that was scarred down. This was cut through with electrocautery and a small pocket was evident some thick mucus in it. The tonsil was freed up from the tonsillar bed using direct pressure and electrocautery. Leading was controlled with electrocautery. The area was watched for a while there is no further bleeding noted. A flexible soft suction catheter was placed in the stomach and suctioned out small amount  of saliva but no evidence of fluid.  The patient tolerated the procedure well. She was awakened and taken to the recovery room in satisfactory condition. There were no operative complications.  Dispo:   To PACU to be discharged home  Plan:  To follow-up in the office in 2-3 weeks. We'll use Lortab liquid for pain and supplement with ibuprofen. She'll push fluids to keep her throat moist and increase her diet as tolerated.  [name]  09/27/2017 8:05 AM

## 2017-09-28 ENCOUNTER — Encounter: Payer: Self-pay | Admitting: Otolaryngology

## 2017-10-01 LAB — SURGICAL PATHOLOGY
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
## 2019-06-09 ENCOUNTER — Other Ambulatory Visit: Payer: Self-pay

## 2019-06-09 DIAGNOSIS — Z20822 Contact with and (suspected) exposure to covid-19: Secondary | ICD-10-CM

## 2019-06-11 LAB — NOVEL CORONAVIRUS, NAA: SARS-CoV-2, NAA: NOT DETECTED

## 2020-03-18 ENCOUNTER — Ambulatory Visit: Payer: Self-pay | Admitting: Internal Medicine
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
## 2020-03-22 ENCOUNTER — Ambulatory Visit: Payer: Managed Care, Other (non HMO) | Admitting: Internal Medicine

## 2020-03-22 ENCOUNTER — Encounter: Payer: Self-pay | Admitting: Internal Medicine

## 2020-03-22 ENCOUNTER — Other Ambulatory Visit: Payer: Self-pay

## 2020-03-22 VITALS — BP 98/56 | HR 87 | Temp 98.1°F | Resp 16 | Ht 64.0 in | Wt 196.8 lb

## 2020-03-22 DIAGNOSIS — E6609 Other obesity due to excess calories: Secondary | ICD-10-CM

## 2020-03-22 DIAGNOSIS — E785 Hyperlipidemia, unspecified: Secondary | ICD-10-CM

## 2020-03-22 DIAGNOSIS — J301 Allergic rhinitis due to pollen: Secondary | ICD-10-CM

## 2020-03-22 DIAGNOSIS — N83201 Unspecified ovarian cyst, right side: Secondary | ICD-10-CM

## 2020-03-22 DIAGNOSIS — Z114 Encounter for screening for human immunodeficiency virus [HIV]: Secondary | ICD-10-CM

## 2020-03-22 DIAGNOSIS — Z7689 Persons encountering health services in other specified circumstances: Secondary | ICD-10-CM

## 2020-03-22 DIAGNOSIS — Z8639 Personal history of other endocrine, nutritional and metabolic disease: Secondary | ICD-10-CM

## 2020-03-22 DIAGNOSIS — Z6833 Body mass index (BMI) 33.0-33.9, adult: Secondary | ICD-10-CM

## 2020-03-22 DIAGNOSIS — N924 Excessive bleeding in the premenopausal period: Secondary | ICD-10-CM

## 2020-03-22 DIAGNOSIS — Z1329 Encounter for screening for other suspected endocrine disorder: Secondary | ICD-10-CM

## 2020-07-23 ENCOUNTER — Ambulatory Visit: Payer: Self-pay | Admitting: *Deleted

## 2020-07-23 ENCOUNTER — Telehealth: Payer: Self-pay | Admitting: Internal Medicine

## 2020-07-23 NOTE — Telephone Encounter (Signed)
See triage note.

## 2020-07-23 NOTE — Telephone Encounter (Unsigned)
Copied from CRM [id number]. Topic: General >> Jul 23, 2020  9:17 AM Marylen Ponto wrote: Reason for CRM: Pt mother stated pt tested positive for Covid and she has some questions regarding medications for the symptoms. Pt mother requests that a nurse return her call [phone]

## 2020-07-23 NOTE — Telephone Encounter (Signed)
Reason for CRM: Pt mother stated pt tested positive for Covid and she has some questions regarding medications for the symptoms. Pt mother requests that a nurse return her call [phone]  Call to patient and mother- reviewed COVID symptoms and treatment. Patient feels she is stable at this time and does not need virtual visit- reviewed office and online options for weekend. She will reach out if she feels she needs to.  Reason for Disposition . [1] COVID-19 diagnosed by HCP (doctor, NP or PA) AND [2] mild symptoms (e.g., cough, fever, others) AND [3] no complications or SOB  Answer Assessment - Initial Assessment Questions 1. COVID-19 DIAGNOSIS: "Who made your Coronavirus (COVID-19) diagnosis?" "Was it confirmed by a positive lab test?" If not diagnosed by a HCP, ask "Are there lots of cases (community spread) where you live?" (See public health department website, if unsure)     Diagnosis with lab  2. COVID-19 EXPOSURE: "Was there any known exposure to COVID before the symptoms began?" CDC Definition of close contact: within 6 feet (2 meters) for a total of 15 minutes or more over a 24-hour period.      unknown 3. ONSET: "When did the COVID-19 symptoms start?"      Monday night 4. WORST SYMPTOM: "What is your worst symptom?" (e.g., cough, fever, shortness of breath, muscle aches)     Body aches- headache 5. COUGH: "Do you have a cough?" If Yes, ask: "How bad is the cough?"       Yes- slight 6. FEVER: "Do you have a fever?" If Yes, ask: "What is your temperature, how was it measured, and when did it start?"     99- low grade 7. RESPIRATORY STATUS: "Describe your breathing?" (e.g., shortness of breath, wheezing, unable to speak)      Slight SOB with exertion- going upstairs 8. BETTER-SAME-WORSE: "Are you getting better, staying the same or getting worse compared to yesterday?"  If getting worse, ask, "In what way?"     Pain is worse- some of the other things are better 9. HIGH RISK DISEASE:  "Do you have any chronic medical problems?" (e.g., asthma, heart or lung disease, weak immune system, obesity, etc.)     no 10. PREGNANCY: "Is there any chance you are pregnant?" "When was your last menstrual period?"       No- IUD 11. OTHER SYMPTOMS: "Do you have any other symptoms?"  (e.g., chills, fatigue, headache, loss of smell or taste, muscle pain, sore throat; new loss of smell or taste especially support the diagnosis of COVID-19)       Fatigue, headache, muscle pain,sore throat/congestion  Protocols used: CORONAVIRUS (COVID-19) DIAGNOSED OR SUSPECTED-A-AH

## 2020-07-23 NOTE — Telephone Encounter (Signed)
Spoke with patient's mother Tamela Oddi. She states she spoke with a nurse and all questions were answered.

## 2020-09-01 ENCOUNTER — Ambulatory Visit: Payer: Managed Care, Other (non HMO) | Admitting: Internal Medicine

## 2020-09-01 ENCOUNTER — Encounter: Payer: Self-pay | Admitting: Internal Medicine

## 2020-09-01 ENCOUNTER — Other Ambulatory Visit: Payer: Self-pay

## 2020-09-01 VITALS — BP 116/80 | HR 76 | Temp 98.3°F | Resp 16 | Ht 64.0 in | Wt 184.0 lb

## 2020-09-01 DIAGNOSIS — S134XXA Sprain of ligaments of cervical spine, initial encounter: Secondary | ICD-10-CM

## 2020-09-01 DIAGNOSIS — S060X0A Concussion without loss of consciousness, initial encounter: Secondary | ICD-10-CM | POA: Diagnosis not present

## 2020-09-01 DIAGNOSIS — S161XXA Strain of muscle, fascia and tendon at neck level, initial encounter: Secondary | ICD-10-CM | POA: Diagnosis not present

## 2020-09-01 DIAGNOSIS — S0083XA Contusion of other part of head, initial encounter: Secondary | ICD-10-CM
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
## 2020-09-01 NOTE — Patient Instructions (Signed)
Concussion, Adult  A concussion is a brain injury from a hard, direct hit (trauma) to your head or body. This direct hit causes the brain to quickly shake back and forth inside the skull. A concussion may also be called a mild traumatic brain injury (TBI). Healing from this injury can take time. What are the causes? This condition is caused by:  A direct hit to your head, such as: ? Running into a player during a game. ? Being hit in a fight. ? Hitting your head on a hard surface.  A quick and sudden movement (jolt) of the head or neck, such as in a car crash. What are the signs or symptoms? The signs of a concussion can be hard to notice. They may be missed by you, family members, and doctors. You may look fine on the outside but may not act or feel normal. Physical symptoms  Headaches.  Being tired (fatigued).  Being dizzy.  Problems with body balance.  Problems seeing or hearing.  Being sensitive to light or noise.  Feeling sick to your stomach (nausea) or throwing up (vomiting).  Not sleeping or eating as you used to.  Loss of feeling (numbness) or tingling in the body.  Seizure. Mental and emotional symptoms  Problems remembering things.  Trouble focusing your mind (concentrating), organizing, or making decisions.  Being slow to think, act, react, speak, or read.  Feeling grouchy (irritable).  Having mood changes.  Feeling worried or nervous (anxious).  Feeling sad (depressed). How is this treated? This condition may be treated by:  Stopping sports or activity if you are injured. If you hit your head or have signs of concussion: ? Do not return to sports or activities the same day. ? Get checked by a doctor before you return to your activities.  Resting your body and your mind.  Being watched carefully, often at home.  Medicines to help with symptoms such as: ? Feeling sick to your stomach. ? Headaches. ? Problems with sleep.  Avoid taking strong  pain medicines (opioids) for a concussion.  Avoiding alcohol and drugs.  Being asked to go to a concussion clinic or a place to help you recover (rehabilitation center). Recovery from a concussion can take time. Return to activities only:  When you are fully healed.  When your doctor says it is safe. Follow these instructions at home: Activity  Limit activities that need a lot of thought or focus, such as: ? Homework or work for your job. ? Watching TV. ? Using the computer or phone. ? Playing memory games and puzzles.  Rest. Rest helps your brain heal. Make sure you: ? Get plenty of sleep. Most adults should get 7-9 hours of sleep each night. ? Rest during the day. Take naps or breaks when you feel tired.  Avoid activity like exercise until your doctor says its safe. Stop any activity that makes symptoms worse.  Do not do activities that could cause a second concussion, such as riding a bike or playing sports.  Ask your doctor when you can return to your normal activities, such as school, work, sports, and driving. Your ability to react may be slower. Do not do these activities if you are dizzy. General instructions   Take over-the-counter and prescription medicines only as told by your doctor.  Do not drink alcohol until your doctor says you can.  Watch your symptoms and tell other people to do the same. Other problems can occur after a concussion. Older   adults have a higher risk of serious problems.  Tell your work manager, teachers, school nurse, school counselor, coach, or athletic trainer about your injury and symptoms. Tell them about what you can or cannot do.  Keep all follow-up visits as told by your doctor. This is important. How is this prevented?  It is very important that you do not get another brain injury. In rare cases, another injury can cause brain damage that will not go away, brain swelling, or death. The risk of this is greatest in the first 7-10 days  after a head injury. To avoid injuries: ? Stop activities that could lead to a second concussion, such as contact sports, until your doctor says it is okay. ? When you return to sports or activities:  Do not crash into other players. This is how most concussions happen.  Follow the rules.  Respect other players. ? Get regular exercise. Do strength and balance training. ? Wear a helmet that fits you well during sports, biking, or other activities.  Helmets can help protect you from serious skull and brain injuries, but they do not protect you from a concussion. Even when wearing a helmet, you should avoid being hit in the head. Contact a doctor if:  Your symptoms get worse or they do not get better.  You have new symptoms.  You have another injury. Get help right away if:  You have bad headaches or your headaches get worse.  You feel weak or numb in any part of your body.  You are mixed up (confused).  Your balance gets worse.  You keep throwing up.  You feel more sleepy than normal.  Your speech is not clear (is slurred).  You cannot recognize people or places.  You have a seizure.  Others have trouble waking you up.  You have behavior changes.  You have changes in how you see (vision).  You pass out (lose consciousness). Summary  A concussion is a brain injury from a hard, direct hit (trauma) to your head or body.  This condition is treated with rest and careful watching of symptoms.  If you keep having symptoms, call your doctor. This information is not intended to replace advice given to you by your health care provider. Make sure you discuss any questions you have with your health care provider. Document Revised: 06/20/2018 Document Reviewed: 06/20/2018 Elsevier Patient Education  2020 [employer]  

## 2022-06-27 ENCOUNTER — Ambulatory Visit: Payer: Managed Care, Other (non HMO) | Admitting: Family Medicine

## 2022-06-27 ENCOUNTER — Ambulatory Visit: Payer: Self-pay

## 2022-06-27 ENCOUNTER — Encounter: Payer: Self-pay | Admitting: Family Medicine

## 2022-06-27 VITALS — BP 118/80 | HR 80 | Temp 98.2°F | Resp 16 | Ht 64.0 in | Wt 199.2 lb

## 2022-06-27 DIAGNOSIS — Z0289 Encounter for other administrative examinations: Secondary | ICD-10-CM

## 2022-06-27 DIAGNOSIS — S60929A Unspecified superficial injury of unspecified hand, initial encounter: Secondary | ICD-10-CM

## 2022-06-27 DIAGNOSIS — M5441 Lumbago with sciatica, right side: Secondary | ICD-10-CM

## 2022-06-27 DIAGNOSIS — S60511A Abrasion of right hand, initial encounter: Secondary | ICD-10-CM | POA: Diagnosis not present

## 2022-06-27 DIAGNOSIS — M25531 Pain in right wrist: Secondary | ICD-10-CM

## 2022-06-27 DIAGNOSIS — S60512A Abrasion of left hand, initial encounter: Secondary | ICD-10-CM | POA: Diagnosis not present

## 2022-06-27 DIAGNOSIS — M25532 Pain in left wrist: Secondary | ICD-10-CM

## 2022-06-27 DIAGNOSIS — S300XXA Contusion of lower back and pelvis, initial encounter: Secondary | ICD-10-CM

## 2022-06-27 DIAGNOSIS — S60919A Unspecified superficial injury of unspecified wrist, initial encounter: Secondary | ICD-10-CM

## 2022-06-27 MED ORDER — TRAMADOL HCL 50 MG PO TABS: 50.0000 mg | ORAL_TABLET | Freq: Two times a day (BID) | ORAL | 0 refills | Status: AC | PRN

## 2022-06-27 MED ORDER — BACLOFEN 5 MG PO TABS: 5.0000 mg | ORAL_TABLET | Freq: Three times a day (TID) | ORAL | 1 refills | Status: AC | PRN
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
## 2022-06-29 ENCOUNTER — Ambulatory Visit: Payer: Managed Care, Other (non HMO) | Admitting: Internal Medicine

## 2022-06-30 ENCOUNTER — Telehealth: Payer: Self-pay

## 2022-06-30 ENCOUNTER — Telehealth: Payer: Self-pay | Admitting: Family Medicine

## 2022-06-30 NOTE — Telephone Encounter (Unsigned)
Copied from CRM [id number]. Topic: General - Other >> Jun 30, 2022  3:41 PM Lyman Speller wrote: Reason for CRM: Bonita Quin from [facility] group called to see if on Tuesday the office received a fax for this pt from [facility] / please call and advise if received

## 2022-06-30 NOTE — Telephone Encounter (Signed)
No answer from left vm to call back office. I am wanting to know when patient was seen at [facility] and if since that day she has not been at work?

## 2022-07-03 NOTE — Telephone Encounter (Signed)
Spoke to [place] from [facility] group, I stated to her that we did receive them papers but patient had brought some exactly the same and we filled them out and they have been faxed over.

## 2022-07-11 ENCOUNTER — Ambulatory Visit: Payer: Managed Care, Other (non HMO) | Admitting: Family Medicine

## 2022-07-11 DIAGNOSIS — S60511A Abrasion of right hand, initial encounter: Secondary | ICD-10-CM

## 2022-07-11 DIAGNOSIS — S60512A Abrasion of left hand, initial encounter: Secondary | ICD-10-CM
